# Patient Record
Sex: Male | Born: 1985 | Race: White | Hispanic: No | Marital: Married | Smoking: Current every day smoker
Health system: Southern US, Community
[De-identification: ages and names within clinical notes are randomized; demographics above are authoritative.]

---

## 2013-12-10 ENCOUNTER — Emergency Department (HOSPITAL_COMMUNITY)
Admission: EM | Admit: 2013-12-10 | Discharge: 2013-12-10 | Disposition: A | Discharge status: Home / Self Care | Payer: Medicaid Other | Attending: Emergency Medicine | Admitting: Emergency Medicine

## 2013-12-10 ENCOUNTER — Encounter (HOSPITAL_COMMUNITY): Payer: Self-pay | Admitting: Emergency Medicine

## 2013-12-10 ENCOUNTER — Emergency Department (HOSPITAL_COMMUNITY): Payer: Medicaid Other

## 2013-12-10 DIAGNOSIS — Y92410 Unspecified street and highway as the place of occurrence of the external cause: Secondary | ICD-10-CM | POA: Insufficient documentation

## 2013-12-10 DIAGNOSIS — Y9389 Activity, other specified: Secondary | ICD-10-CM | POA: Insufficient documentation

## 2013-12-10 DIAGNOSIS — F17200 Nicotine dependence, unspecified, uncomplicated: Secondary | ICD-10-CM | POA: Insufficient documentation

## 2013-12-10 DIAGNOSIS — R319 Hematuria, unspecified: Secondary | ICD-10-CM | POA: Diagnosis present

## 2013-12-10 DIAGNOSIS — S301XXA Contusion of abdominal wall, initial encounter: Secondary | ICD-10-CM | POA: Insufficient documentation

## 2013-12-10 LAB — CBC WITH DIFFERENTIAL/PLATELET
BASOS PCT: 1 % (ref 0–1)
Basophils Absolute: 0 10*3/uL (ref 0.0–0.1)
EOS ABS: 0.2 10*3/uL (ref 0.0–0.7)
Eosinophils Relative: 3 % (ref 0–5)
HEMATOCRIT: 45.9 % (ref 39.0–52.0)
HEMOGLOBIN: 15 g/dL (ref 13.0–17.0)
LYMPHS ABS: 2.5 10*3/uL (ref 0.7–4.0)
Lymphocytes Relative: 30 % (ref 12–46)
MCH: 30.5 pg (ref 26.0–34.0)
MCHC: 32.7 g/dL (ref 30.0–36.0)
MCV: 93.5 fL (ref 78.0–100.0)
MONOS PCT: 6 % (ref 3–12)
Monocytes Absolute: 0.5 10*3/uL (ref 0.1–1.0)
NEUTROS PCT: 60 % (ref 43–77)
Neutro Abs: 5.1 10*3/uL (ref 1.7–7.7)
Platelets: 368 10*3/uL (ref 150–400)
RBC: 4.91 MIL/uL (ref 4.22–5.81)
RDW: 14.4 % (ref 11.5–15.5)
WBC: 8.3 10*3/uL (ref 4.0–10.5)

## 2013-12-10 LAB — COMPREHENSIVE METABOLIC PANEL
ALK PHOS: 78 U/L (ref 39–117)
ALT: 12 U/L (ref 0–53)
ANION GAP: 8 (ref 5–15)
AST: 13 U/L (ref 0–37)
Albumin: 4 g/dL (ref 3.5–5.2)
BUN: 14 mg/dL (ref 6–23)
CO2: 28 mEq/L (ref 19–32)
CREATININE: 0.77 mg/dL (ref 0.50–1.35)
Calcium: 9.3 mg/dL (ref 8.4–10.5)
Chloride: 105 mEq/L (ref 96–112)
GFR calc non Af Amer: >90 mL/min (ref >90)
GLUCOSE: 114 mg/dL — AB (ref 70–99)
POTASSIUM: 4.9 meq/L (ref 3.7–5.3)
Sodium: 141 mEq/L (ref 137–147)
TOTAL PROTEIN: 7.5 g/dL (ref 6.0–8.3)
Total Bilirubin: <0.2 mg/dL — ABNORMAL LOW (ref 0.3–1.2)

## 2013-12-10 LAB — URINALYSIS, ROUTINE W REFLEX MICROSCOPIC
BILIRUBIN URINE: NEGATIVE
Glucose, UA: NEGATIVE mg/dL
Hgb urine dipstick: NEGATIVE
Ketones, ur: NEGATIVE mg/dL
Leukocytes, UA: NEGATIVE
NITRITE: NEGATIVE
PH: 5.5 (ref 5.0–8.0)
Protein, ur: NEGATIVE mg/dL
Specific Gravity, Urine: 1.015 (ref 1.005–1.030)
Urobilinogen, UA: 0.2 mg/dL (ref 0.0–1.0)

## 2013-12-10 MED ORDER — SODIUM CHLORIDE 0.9 % IV BOLUS (SEPSIS)
500.0000 mL | Freq: Once | INTRAVENOUS | Status: AC
  Administered 2013-12-10: 500 mL via INTRAVENOUS

## 2013-12-10 MED ORDER — IOHEXOL 300 MG/ML  SOLN
100.0000 mL | Freq: Once | INTRAMUSCULAR | Status: AC | PRN
  Administered 2013-12-10: 100 mL via INTRAVENOUS

## 2013-12-10 MED ORDER — TRAMADOL HCL 50 MG PO TABS: 50.0000 mg | ORAL_TABLET | Freq: Four times a day (QID) | ORAL | Status: AC | PRN
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
## 2013-12-10 NOTE — Discharge Instructions (Signed)
Follow up as needed
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
## 2017-11-13 ENCOUNTER — Emergency Department (HOSPITAL_COMMUNITY)
Admission: EM | Admit: 2017-11-13 | Discharge: 2017-11-13 | Disposition: A | Discharge status: Home / Self Care | Payer: No Typology Code available for payment source | Attending: Emergency Medicine | Admitting: Emergency Medicine

## 2017-11-13 ENCOUNTER — Other Ambulatory Visit: Payer: Self-pay

## 2017-11-13 ENCOUNTER — Encounter (HOSPITAL_COMMUNITY): Payer: Self-pay | Admitting: Emergency Medicine

## 2017-11-13 DIAGNOSIS — K0889 Other specified disorders of teeth and supporting structures: Secondary | ICD-10-CM | POA: Insufficient documentation

## 2017-11-13 DIAGNOSIS — F17200 Nicotine dependence, unspecified, uncomplicated: Secondary | ICD-10-CM | POA: Insufficient documentation

## 2017-11-13 DIAGNOSIS — R51 Headache: Secondary | ICD-10-CM | POA: Diagnosis present

## 2017-11-13 MED ORDER — NAPROXEN 500 MG PO TABS: 500.0000 mg | ORAL_TABLET | Freq: Two times a day (BID) | ORAL | 0 refills | Status: AC

## 2017-11-13 MED ORDER — HYDROCODONE-ACETAMINOPHEN 5-325 MG PO TABS
1.0000 | ORAL_TABLET | Freq: Once | ORAL | Status: AC
  Administered 2017-11-13: 1 via ORAL
  Filled 2017-11-13: qty 1

## 2017-11-13 MED ORDER — PENICILLIN V POTASSIUM 500 MG PO TABS: 500.0000 mg | ORAL_TABLET | Freq: Four times a day (QID) | ORAL | 0 refills | Status: AC

## 2017-11-13 MED ORDER — NAPROXEN 250 MG PO TABS
500.0000 mg | ORAL_TABLET | Freq: Once | ORAL | Status: AC
  Administered 2017-11-13: 500 mg via ORAL
  Filled 2017-11-13: qty 2

## 2017-11-13 NOTE — Discharge Instructions (Addendum)
You were seen today for facial pain.  This is likely related to an infected tooth.  You need to follow-up with dentistry.  Take medications as prescribed.

## 2017-11-13 NOTE — ED Triage Notes (Signed)
Pt C/O right sided facial pain that started Friday night. Pt states he has been trying hot and cold pack and ibuprofen with no relief. Pt denies fevers at home.

## 2017-11-13 NOTE — ED Provider Notes (Signed)
[facility] Provider Note   CSN: [id number] Arrival date & time: 11/13/17  2015     History   Chief Complaint Chief Complaint  Patient presents with  . Facial Pain    HPI Blythe StanfordBrandon Camp is a 32 y.o. male.  HPI  This is a 32 year old male who presents with right-sided facial pain.  Patient reports that he woke up Friday night with sharp right lower jaw pain.  Pain is nonradiating.  Currently is 9 out of 10.  He states that he took ibuprofen with minimal relief.  He denies any fevers or upper respiratory symptoms.  Nothing seems to make the pain better or worse.  He has not seen a dentist.  Denies any difficulty swallowing.  History reviewed. No pertinent past medical history.  There are no active problems to display for this patient.   History reviewed. No pertinent surgical history.      Home Medications    Prior to Admission medications   Medication Sig Start Date End Date Taking? Authorizing Provider  acetaminophen (TYLENOL) 500 MG tablet Take 1,000-1,500 mg by mouth every 6 (six) hours as needed for mild pain.    [provider]  ibuprofen (ADVIL,MOTRIN) 200 MG tablet Take 400 mg by mouth every 6 (six) hours as needed for mild pain.    [provider]  naproxen (NAPROSYN) 500 MG tablet Take 1 tablet (500 mg total) by mouth 2 (two) times daily. 11/13/17   Horton, Mayer Maskerourtney F, MD  penicillin v potassium (VEETID) 500 MG tablet Take 1 tablet (500 mg total) by mouth 4 (four) times daily for 10 days. 11/13/17 11/23/17  Horton, Mayer Maskerourtney F, MD  traMADol (ULTRAM) 50 MG tablet Take 1 tablet (50 mg total) by mouth every 6 (six) hours as needed. 12/10/13   Bethann BerkshireZammit, Joseph, MD    Family History No family history on file.  Social History Social History   Tobacco Use  . Smoking status: Current Every Day Smoker  . Smokeless tobacco: Never Used  Substance Use Topics  . Alcohol use: No  . Drug use: No     Allergies   Patient has no known  allergies.   Review of Systems Review of Systems  Constitutional: Negative for fever.  HENT: Positive for dental problem. Negative for congestion, ear pain, facial swelling and trouble swallowing.   All other systems reviewed and are negative.    Physical Exam Updated Vital Signs BP 135/73 (BP Location: Right Arm)   Pulse 80   Temp 98.2 F (36.8 C) (Oral)   Resp 18   Ht 5\' 11"  (1.803 m)   Wt 72.6 kg (160 lb 2 oz)   SpO2 100%   BMI 22.33 kg/m   Physical Exam  Constitutional: He is oriented to person, place, and time. He appears well-developed and well-nourished. No distress.  HENT:  Head: Normocephalic and atraumatic.  Tenderness palpation over the lower right gumline adjacent to palpable abscess, no trismus, no fullness noted under the tongue, only poor dentition with multiple dental caries and decay noted  Eyes: Pupils are equal, round, and reactive to light.  Neck: Normal range of motion. Neck supple.  Cardiovascular: Normal rate, regular rhythm and normal heart sounds.  No murmur heard. Pulmonary/Chest: Effort normal. No respiratory distress. He has wheezes.  Musculoskeletal: He exhibits no edema.  Neurological: He is alert and oriented to person, place, and time.  Skin: Skin is warm and dry.  Psychiatric: He has a normal mood and affect.  Nursing note and vitals reviewed.    ED Treatments / Results  Labs (all labs ordered are listed, but only abnormal results are displayed) Labs Reviewed - No data to display  EKG None  Radiology No results found.  Procedures Procedures (including critical care time)  Medications Ordered in ED Medications  naproxen (NAPROSYN) tablet 500 mg (has no administration in time range)  HYDROcodone-acetaminophen (NORCO/VICODIN) 5-325 MG per tablet 1 tablet (has no administration in time range)     Initial Impression / Assessment and Plan / ED Course  I have reviewed the triage vital signs and the nursing notes.  Pertinent  labs & imaging results that were available during my care of the patient were reviewed by me and considered in my medical decision making (see chart for details).     Patient presents with facial pain.  Likely dental in origin.  No drainable abscess.  Suspect occult infection.  Patient was given Norco and naproxen.  Will discharge home with scheduled naproxen and penicillin.  Patient was given resources for dental follow-up.  After history, exam, and medical workup I feel the patient has been appropriately medically screened and is safe for discharge home. Pertinent diagnoses were discussed with the patient. Patient was given return precautions.   Final Clinical Impressions(s) / ED Diagnoses   Final diagnoses:  Pain, dental    ED Discharge Orders        Ordered    naproxen (NAPROSYN) 500 MG tablet  2 times daily     11/13/17 2330    penicillin v potassium (VEETID) 500 MG tablet  4 times daily     11/13/17 2330       Shon Baton, MD 11/13/17 2334
